# Patient Record
Sex: Female | Born: 1980 | Race: Black or African American | Hispanic: No | Marital: Single | Smoking: Never smoker
Health system: Southern US, Community
[De-identification: ages and names within clinical notes are randomized; demographics above are authoritative.]

## PROBLEM LIST (undated history)

## (undated) DIAGNOSIS — G43909 Migraine, unspecified, not intractable, without status migrainosus: Secondary | ICD-10-CM

---

## 2004-06-06 ENCOUNTER — Encounter: Admission: RE | Admit: 2004-06-06 | Discharge: 2004-06-06 | Payer: Self-pay | Admitting: Specialist

## 2010-01-07 ENCOUNTER — Ambulatory Visit: Payer: Self-pay

## 2011-01-31 IMAGING — CR DG CHEST 1V
1 series · 1 of 1 positions shown · non-contrast
Comparison: none

REASON FOR EXAM: +QFG send report to [REDACTED] [phone] to [facility]
Escalante Flores
COMMENTS:

PROCEDURE:     DXR - DXR CHEST 1 VIEWAP OR PA  - January 07, 2010  [DATE]
RESULT:     The lungs are clear. The heart and pulmonary vessels are normal.
The bony and mediastinal structures are unremarkable. There is no effusion.
There is no pneumothorax or evidence of congestive failure.

[view not recorded]
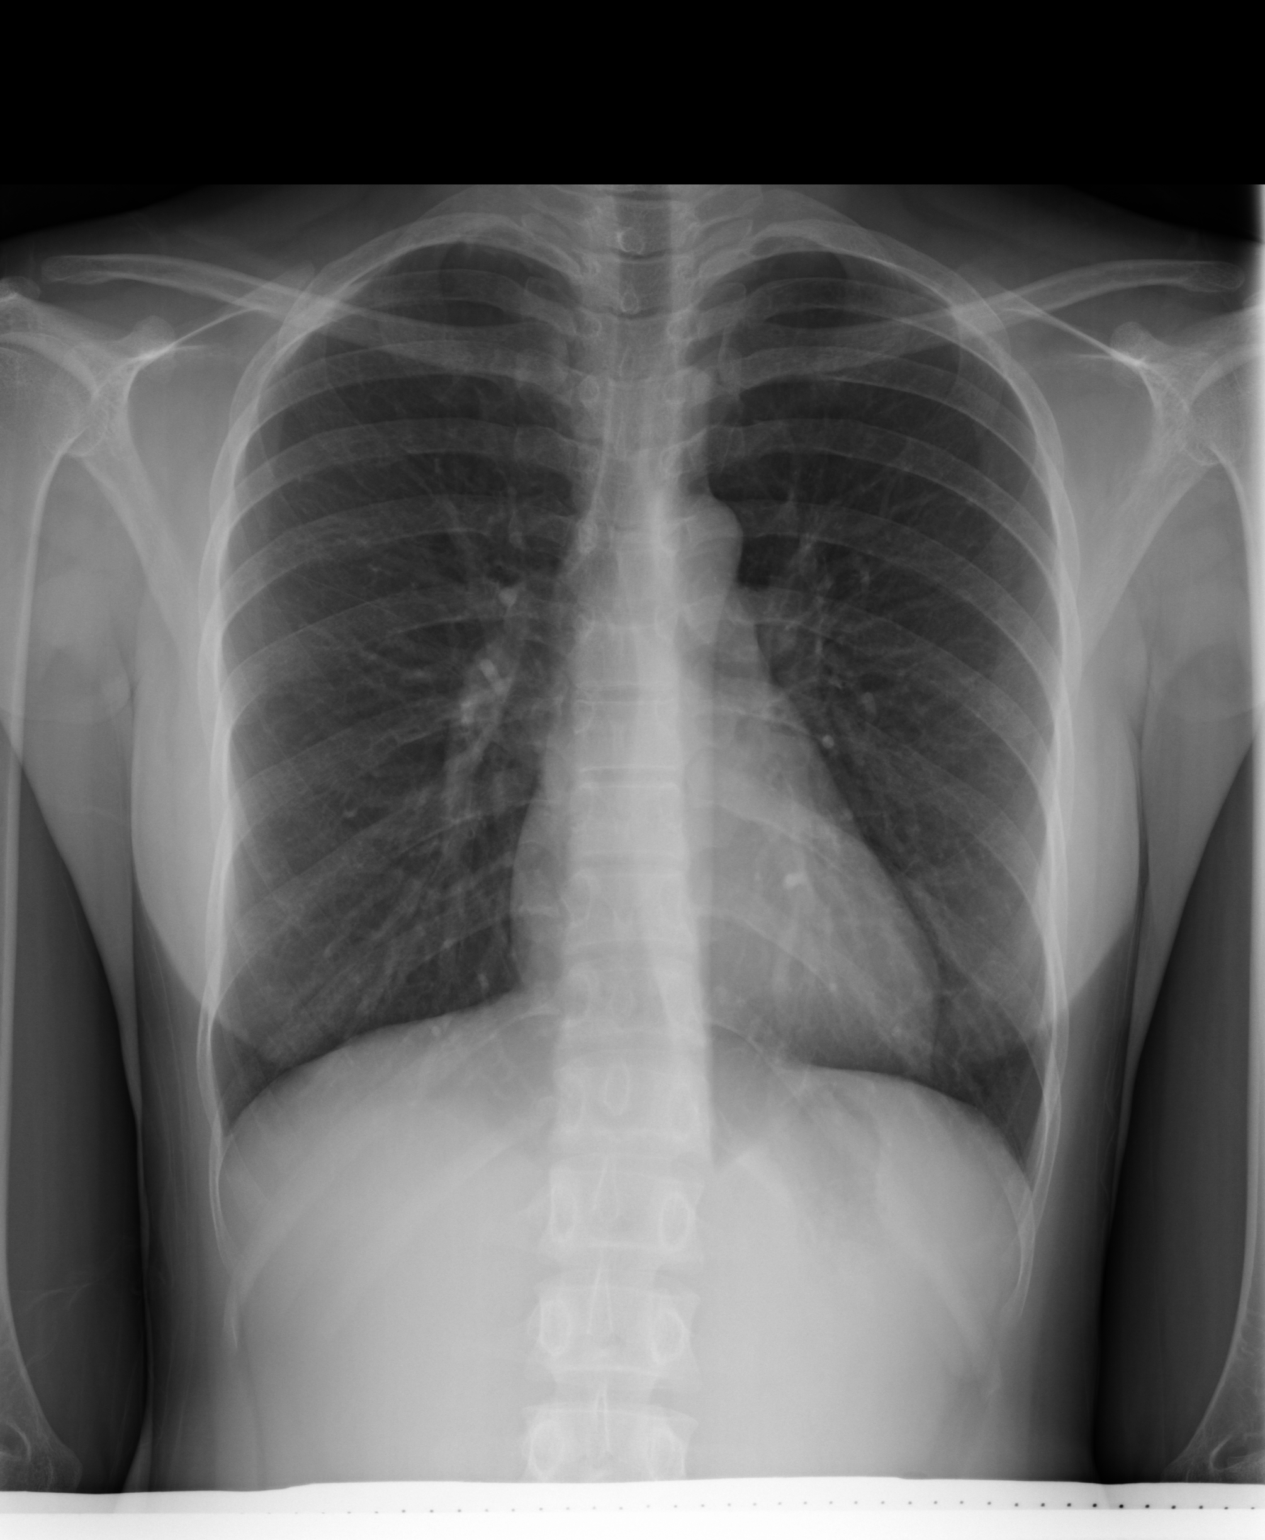

[1 of 1 positions shown; findings below may reference images not displayed]

IMPRESSION: No acute cardiopulmonary disease.

## 2019-06-14 ENCOUNTER — Other Ambulatory Visit: Payer: Self-pay

## 2019-06-14 ENCOUNTER — Ambulatory Visit
Admission: EM | Admit: 2019-06-14 | Discharge: 2019-06-14 | Disposition: A | Discharge status: Home / Self Care | Payer: Managed Care, Other (non HMO) | Attending: Family Medicine | Admitting: Family Medicine

## 2019-06-14 ENCOUNTER — Encounter: Payer: Self-pay | Admitting: Emergency Medicine

## 2019-06-14 DIAGNOSIS — R1013 Epigastric pain: Secondary | ICD-10-CM

## 2019-06-14 MED ORDER — OMEPRAZOLE 40 MG PO CPDR: 40.0000 mg | DELAYED_RELEASE_CAPSULE | Freq: Every day | ORAL | 0 refills | Status: AC

## 2019-06-14 NOTE — Discharge Instructions (Addendum)
Over the counter TUMS Avoid spicy foods and caffeine Follow up with Primary Care provider

## 2019-06-14 NOTE — ED Provider Notes (Signed)
[facility]    CSN: [id number] Arrival date & time: 06/14/19  1221      History   Chief Complaint Chief Complaint  Patient presents with  . Abdominal Pain    LUQ    HPI Jacqueline Gross is a 40 y.o. female.   39 yo female with a c/o epigastric and left upper quadrant abdominal pain for the past week. Patient states she has a h/o stomach ulcer and symptoms are similar to prior flare up episode. She had stop taking her prilosec and recently had been eating more spicy foods and caffeine. Denies any fevers, chills, nausea, vomiting, constipation, diarrhea, melena, hematochezia, urinary symptoms. States eating helps with the pain.    Abdominal Pain   History reviewed. No pertinent past medical history.  There are no problems to display for this patient.   Past Surgical History:  Procedure Laterality Date  . CESAREAN SECTION      OB History   No obstetric history on file.      Home Medications    Prior to Admission medications   Medication Sig Start Date End Date Taking? Authorizing Provider  levonorgestrel (MIRENA) 20 MCG/24HR IUD by Intrauterine route.   Yes [provider]  omeprazole (PRILOSEC) 40 MG capsule Take 1 capsule (40 mg total) by mouth daily. 06/14/19   Norval Gable, MD    Family History Family History  Problem Relation Age of Onset  . GI Disease Mother   . Diabetes Father     Social History Social History   Tobacco Use  . Smoking status: Never Smoker  . Smokeless tobacco: Never Used  Substance Use Topics  . Alcohol use: Not Currently  . Drug use: Not Currently     Allergies   Patient has no known allergies.   Review of Systems Review of Systems  Gastrointestinal: Positive for abdominal pain.     Physical Exam Triage Vital Signs ED Triage Vitals  Enc Vitals Group     BP 06/14/19 1241 117/69     Pulse Rate 06/14/19 1241 74     Resp 06/14/19 1241 18     Temp 06/14/19 1241 98.7 F (37.1 C)     Temp Source  06/14/19 1241 Oral     SpO2 06/14/19 1241 100 %     Weight 06/14/19 1237 110 lb (49.9 kg)     Height 06/14/19 1237 5\' 2"  (1.575 m)     Head Circumference --      Peak Flow --      Pain Score 06/14/19 1236 6     Pain Loc --      Pain Edu? --      Excl. in [place]? --    No data found.  Updated Vital Signs BP 117/69 (BP Location: Left Arm)   Pulse 74   Temp 98.7 F (37.1 C) (Oral)   Resp 18   Ht 5\' 2"  (1.575 m)   Wt 49.9 kg   SpO2 100%   BMI 20.12 kg/m   Visual Acuity Right Eye Distance:   Left Eye Distance:   Bilateral Distance:    Right Eye Near:   Left Eye Near:    Bilateral Near:     Physical Exam Vitals and nursing note reviewed.  Constitutional:      General: She is not in acute distress.    Appearance: Normal appearance. She is not ill-appearing, toxic-appearing or diaphoretic.  Cardiovascular:     Rate and Rhythm: Normal rate.  Pulmonary:  Effort: Pulmonary effort is normal. No respiratory distress.  Abdominal:     General: Bowel sounds are normal. There is no distension.     Palpations: Abdomen is soft. There is no mass.     Tenderness: There is abdominal tenderness (epigastric). There is no right CVA tenderness, left CVA tenderness, guarding or rebound.     Hernia: No hernia is present.  Neurological:     Mental Status: She is alert.      UC Treatments / Results  Labs (all labs ordered are listed, but only abnormal results are displayed) Labs Reviewed - No data to display  EKG   Radiology No results found.  Procedures Procedures (including critical care time)  Medications Ordered in UC Medications - No data to display  Initial Impression / Assessment and Plan / UC Course  I have reviewed the triage vital signs and the nursing notes.  Pertinent labs & imaging results that were available during my care of the patient were reviewed by me and considered in my medical decision making (see chart for details).      Final Clinical  Impressions(s) / UC Diagnoses   Final diagnoses:  Dyspepsia     Discharge Instructions     Over the counter TUMS Avoid spicy foods and caffeine Follow up with Primary Care provider    ED Prescriptions    Medication Sig Dispense Auth. Provider   omeprazole (PRILOSEC) 40 MG capsule Take 1 capsule (40 mg total) by mouth daily. 30 capsule Payton Mccallum, MD      1. diagnosis reviewed with patient 2. rx as per orders above; reviewed possible side effects, interactions, risks and benefits  3. Recommend supportive treatment as above 4. Follow-up prn if symptoms worsen or don't improve   PDMP not reviewed this encounter.   Payton Mccallum, MD 06/14/19 [phone]

## 2019-06-14 NOTE — ED Triage Notes (Signed)
Pt c/o LUQ abdominal pain. She states that it started yesterday. She has had this before and was a stomach ulcer. She states this feel similar. Denies Nausea, vomiting or diarrhea. She has not tried anything for her symptoms. She states the pain is worse when she does not eat, rather than when she eats.

## 2019-07-17 ENCOUNTER — Ambulatory Visit: Payer: Managed Care, Other (non HMO) | Attending: Internal Medicine

## 2019-07-17 ENCOUNTER — Other Ambulatory Visit: Payer: Self-pay

## 2019-07-17 DIAGNOSIS — Z23 Encounter for immunization: Secondary | ICD-10-CM

## 2019-07-17 NOTE — Progress Notes (Signed)
   Covid-19 Vaccination Clinic  Name:  Jacqueline Gross    MRN: [id number] DOB: Oct 25, 1980  07/17/2019  [name] was observed post Covid-19 immunization for 15 minutes without incident. She was provided with Vaccine Information Sheet and instruction to access the V-Safe system.   [name] was instructed to call 911 with any severe reactions post vaccine: Marland Kitchen Difficulty breathing  . Swelling of face and throat  . A fast heartbeat  . A bad rash all over body  . Dizziness and weakness   Immunizations Administered    Name Date Dose VIS Date Route   Pfizer COVID-19 Vaccine 07/17/2019  9:13 AM 0.3 mL 04/04/2019 Intramuscular   Manufacturer: [employer], [employer]   Lot: [id number]   NDC: [address]-8

## 2019-08-12 ENCOUNTER — Ambulatory Visit: Payer: Self-pay | Attending: Internal Medicine

## 2019-08-12 DIAGNOSIS — Z23 Encounter for immunization: Secondary | ICD-10-CM

## 2019-08-12 NOTE — Progress Notes (Signed)
   Covid-19 Vaccination Clinic  Name:  Burkley Dech    MRN: [id number] DOB: [birth year]  08/12/2019  Ms. Whipple was observed post Covid-19 immunization for 15 minutes without incident. She was provided with Vaccine Information Sheet and instruction to access the V-Safe system.   Ms. Kiernan was instructed to call 911 with any severe reactions post vaccine: Marland Kitchen Difficulty breathing  . Swelling of face and throat  . A fast heartbeat  . A bad rash all over body  . Dizziness and weakness   Immunizations Administered    Name Date Dose VIS Date Route   Pfizer COVID-19 Vaccine 08/12/2019  8:36 AM 0.3 mL 06/18/2018 Intramuscular   Manufacturer: [employer], [employer]   Lot: [id number]   NDC: [address]-4

## 2022-01-06 ENCOUNTER — Encounter: Payer: Self-pay | Admitting: Emergency Medicine

## 2022-01-06 ENCOUNTER — Ambulatory Visit
Admission: EM | Admit: 2022-01-06 | Discharge: 2022-01-06 | Disposition: A | Discharge status: Home / Self Care | Payer: BC Managed Care – PPO | Attending: Physician Assistant | Admitting: Physician Assistant

## 2022-01-06 DIAGNOSIS — G43809 Other migraine, not intractable, without status migrainosus: Secondary | ICD-10-CM

## 2022-01-06 DIAGNOSIS — N6320 Unspecified lump in the left breast, unspecified quadrant: Secondary | ICD-10-CM

## 2022-01-06 HISTORY — DX: Migraine, unspecified, not intractable, without status migrainosus: G43.909

## 2022-01-06 MED ORDER — CEPHALEXIN 500 MG PO CAPS: 500.0000 mg | ORAL_CAPSULE | Freq: Four times a day (QID) | ORAL | 0 refills | Status: AC

## 2022-01-06 NOTE — Discharge Instructions (Signed)
-  Possible abscess beneath breast.  I have sent antibiotics.  Also use warm compresses on the area. - As we discussed, if this is not improving or if it worsens, please make a follow-up appointment with your OB/GYN. - Take the sumatriptan week at home for your headache.  Rest and increase your fluids.

## 2022-01-06 NOTE — ED Provider Notes (Signed)
[facility]    CSN: [id number] Arrival date & time: 01/06/22  0846      History   Chief Complaint Chief Complaint  Patient presents with   Abscess    Left breast    HPI Jacqueline Gross is a 41 y.o. female presenting for swollen area beneath the left breast that she noticed today.  She reports that she had a blister above it a couple days ago that popped but she is not sure what came out of it.  She denies any pain in that area but the lump is a little sore.  She has not treated condition in any way.  She reports that she had a mammogram a year ago and everything was normal, reports that she just has "dense" breasts.  Reports that she follows up with OB/GYN.  Recently had a Mirena placed about 7 months ago and states she has had headaches and irregular bleeding since then.  She is not scheduled to have any follow-ups with OB/GYN.  She is reporting onset of right-sided headache with a little bit of dizziness today.  Has a history of migraines and takes sumatriptan but has not tried that yet because she does not feel symptoms are that bad.  Denies any severe headaches, vision changes, vomiting.  No other complaints.  HPI  Past Medical History:  Diagnosis Date   Migraine     There are no problems to display for this patient.   Past Surgical History:  Procedure Laterality Date   CESAREAN SECTION      OB History   No obstetric history on file.      Home Medications    Prior to Admission medications   Medication Sig Start Date End Date Taking? Authorizing Provider  cephALEXin (KEFLEX) 500 MG capsule Take 1 capsule (500 mg total) by mouth 4 (four) times daily for 7 days. 01/06/22 01/13/22 Yes Shirlee Latch, PA-C  levonorgestrel (MIRENA) 20 MCG/24HR IUD by Intrauterine route.   Yes [provider]  omeprazole (PRILOSEC) 40 MG capsule Take 1 capsule (40 mg total) by mouth daily. 06/14/19   Payton Mccallum, MD    Family History Family History  Problem Relation  Age of Onset   GI Disease Mother    Diabetes Father     Social History Social History   Tobacco Use   Smoking status: Never   Smokeless tobacco: Never  Vaping Use   Vaping Use: Never used  Substance Use Topics   Alcohol use: Not Currently   Drug use: Not Currently     Allergies   Patient has no known allergies.   Review of Systems Review of Systems  Constitutional:  Negative for fatigue and fever.  Respiratory:  Negative for shortness of breath.   Cardiovascular:  Negative for chest pain.  Gastrointestinal:  Positive for nausea. Negative for abdominal pain and vomiting.  Skin:  Negative for color change, rash and wound.       +breast lump  Neurological:  Positive for dizziness and headaches. Negative for syncope, weakness and numbness.     Physical Exam Triage Vital Signs ED Triage Vitals  Enc Vitals Group     BP 01/06/22 0912 113/74     Pulse Rate 01/06/22 0912 69     Resp 01/06/22 0912 14     Temp 01/06/22 0912 98.6 F (37 C)     Temp Source 01/06/22 0912 Oral     SpO2 01/06/22 0912 99 %     Weight  01/06/22 0910 123 lb (55.8 kg)     Height 01/06/22 0910 5\' 2"  (1.575 m)     Head Circumference --      Peak Flow --      Pain Score 01/06/22 0910 8     Pain Loc --      Pain Edu? --      Excl. in [place]? --    No data found.  Updated Vital Signs BP 113/74 (BP Location: Left Arm)   Pulse 69   Temp 98.6 F (37 C) (Oral)   Resp 14   Ht 5\' 2"  (1.575 m)   Wt 123 lb (55.8 kg)   SpO2 99%   BMI 22.50 kg/m       Physical Exam Vitals and nursing note reviewed.  Constitutional:      General: She is not in acute distress.    Appearance: Normal appearance. She is not ill-appearing or toxic-appearing.  HENT:     Head: Normocephalic and atraumatic.  Eyes:     General: No scleral icterus.       Right eye: No discharge.        Left eye: No discharge.     Conjunctiva/sclera: Conjunctivae normal.  Cardiovascular:     Rate and Rhythm: Normal rate and regular  rhythm.     Heart sounds: Normal heart sounds.  Pulmonary:     Effort: Pulmonary effort is normal. No respiratory distress.     Breath sounds: Normal breath sounds.  Chest:     Chest wall: Mass and tenderness present. No swelling.       Comments: There is a 1.5 cm x 0.5 cm area of induration as indicated in picture above. Area is mildly TTP. No erythema of skin, increased warmth, abrasions, wounds. Musculoskeletal:     Cervical back: Neck supple.  Skin:    General: Skin is dry.  Neurological:     General: No focal deficit present.     Mental Status: She is alert. Mental status is at baseline.     Motor: No weakness.     Gait: Gait normal.  Psychiatric:        Mood and Affect: Mood normal.        Behavior: Behavior normal.        Thought Content: Thought content normal.      UC Treatments / Results  Labs (all labs ordered are listed, but only abnormal results are displayed) Labs Reviewed - No data to display  EKG   Radiology No results found.  Procedures Procedures (including critical care time)  Medications Ordered in UC Medications - No data to display  Initial Impression / Assessment and Plan / UC Course  I have reviewed the triage vital signs and the nursing notes.  Pertinent labs & imaging results that were available during my care of the patient were reviewed by me and considered in my medical decision making (see chart for details).   41 year old female presenting for area of tenderness and swelling under and adjacent to the left breast that she noticed today.  Reports that she had a blister or bump above that that popped a couple days ago.  She has had a mammogram in the last year and states that it was normal.  Also reporting a headache but has history of migraines.  On exam she has a 1 by half centimeter by 0.5 cm area of induration adjacent to the left breast as shown in picture.  It is tender to palpation.  There is no overlying erythema, warmth, wounds  or abrasions.  Suspect patient's area of swelling could be abscess or soft tissue infection since she had a blister or bump pop in the area before onset of the symptoms.  We also discussed that if her symptoms are not improving with antibiotic I prescribed her if they worsen she may need to follow-up with OB/GYN and have ultrasound to assess condition.  Patient reports that she will follow-up with OB/GYN regarding this.  Advised patient to take her sumatriptan when she gets home for the migraine, lay down to rest increase fluids.  Advised to follow-up as needed especially if migraines not improving.   Final Clinical Impressions(s) / UC Diagnoses   Final diagnoses:  Mass of left breast, unspecified quadrant  Other migraine without status migrainosus, not intractable     Discharge Instructions      -Possible abscess beneath breast.  I have sent antibiotics.  Also use warm compresses on the area. - As we discussed, if this is not improving or if it worsens, please make a follow-up appointment with your OB/GYN. - Take the sumatriptan week at home for your headache.  Rest and increase your fluids.     ED Prescriptions     Medication Sig Dispense Auth. Provider   cephALEXin (KEFLEX) 500 MG capsule Take 1 capsule (500 mg total) by mouth 4 (four) times daily for 7 days. 28 capsule Shirlee Latch, PA-C      PDMP not reviewed this encounter.   Shirlee Latch, PA-C 01/06/22 [phone]

## 2022-01-06 NOTE — ED Triage Notes (Signed)
Patient c/o left breast abscess that started last week.  Patient reports pain and swelling in her left breast.  Patient also reports HAs and fatigue that started 2 days ago.
